# Patient Record
Sex: Female | Born: 1995 | Race: White | Hispanic: No | Marital: Single | Smoking: Never smoker
Health system: Southern US, Community
[De-identification: ages and names within clinical notes are randomized; demographics above are authoritative.]

## PROBLEM LIST (undated history)

## (undated) DIAGNOSIS — R42 Dizziness and giddiness: Secondary | ICD-10-CM

## (undated) DIAGNOSIS — I471 Supraventricular tachycardia, unspecified: Secondary | ICD-10-CM

## (undated) DIAGNOSIS — R0600 Dyspnea, unspecified: Secondary | ICD-10-CM

## (undated) DIAGNOSIS — K589 Irritable bowel syndrome without diarrhea: Secondary | ICD-10-CM

## (undated) DIAGNOSIS — R55 Syncope and collapse: Secondary | ICD-10-CM

## (undated) DIAGNOSIS — J45998 Other asthma: Secondary | ICD-10-CM

## (undated) DIAGNOSIS — R062 Wheezing: Secondary | ICD-10-CM

## (undated) DIAGNOSIS — L309 Dermatitis, unspecified: Secondary | ICD-10-CM

## (undated) DIAGNOSIS — I951 Orthostatic hypotension: Secondary | ICD-10-CM

## (undated) DIAGNOSIS — R0602 Shortness of breath: Secondary | ICD-10-CM

## (undated) DIAGNOSIS — R000 Tachycardia, unspecified: Secondary | ICD-10-CM

## (undated) DIAGNOSIS — R002 Palpitations: Secondary | ICD-10-CM

## (undated) DIAGNOSIS — K3184 Gastroparesis: Secondary | ICD-10-CM

## (undated) HISTORY — DX: Supraventricular tachycardia, unspecified: I47.10

## (undated) HISTORY — DX: Dizziness and giddiness: R42

## (undated) HISTORY — DX: Dermatitis, unspecified: L30.9

## (undated) HISTORY — DX: Orthostatic hypotension: I95.1

## (undated) HISTORY — PX: WISDOM TOOTH EXTRACTION: SHX21

## (undated) HISTORY — DX: Wheezing: R06.2

## (undated) HISTORY — PX: NO PAST SURGERIES: SHX2092

## (undated) HISTORY — DX: Other asthma: J45.998

## (undated) HISTORY — DX: Shortness of breath: R06.02

## (undated) HISTORY — DX: Syncope and collapse: R55

## (undated) HISTORY — DX: Tachycardia, unspecified: R00.0

## (undated) HISTORY — DX: Dyspnea, unspecified: R06.00

## (undated) HISTORY — DX: Supraventricular tachycardia: I47.1

## (undated) HISTORY — DX: Palpitations: R00.2

---
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
## 2015-02-02 ENCOUNTER — Other Ambulatory Visit: Payer: Self-pay | Admitting: Cardiovascular Disease

## 2015-09-18 ENCOUNTER — Telehealth: Payer: Self-pay | Admitting: Internal Medicine

## 2015-09-18 NOTE — Telephone Encounter (Signed)
New Message  Mother called states that the pt is now in college in [place]   1. Can you recommend a cardiologist while she is at school?   2. Her SVT has gotten worse. Requests a call back to determine if medications can be re evaluated.

## 2015-09-19 NOTE — Telephone Encounter (Signed)
Left message for Beth(mom) that Dr Ladona Ridgel did not know any MD's in that area but he would be glad to see her when she is home on break from school.  I have asked she call me back if needed

## 2015-10-03 ENCOUNTER — Encounter: Payer: Self-pay | Admitting: *Deleted

## 2015-10-05 ENCOUNTER — Ambulatory Visit (INDEPENDENT_AMBULATORY_CARE_PROVIDER_SITE_OTHER): Payer: BLUE CROSS/BLUE SHIELD

## 2015-10-05 ENCOUNTER — Encounter: Payer: Self-pay | Admitting: Internal Medicine

## 2015-10-05 ENCOUNTER — Ambulatory Visit (INDEPENDENT_AMBULATORY_CARE_PROVIDER_SITE_OTHER): Payer: BLUE CROSS/BLUE SHIELD | Admitting: Internal Medicine

## 2015-10-05 ENCOUNTER — Ambulatory Visit (INDEPENDENT_AMBULATORY_CARE_PROVIDER_SITE_OTHER): Payer: BLUE CROSS/BLUE SHIELD | Admitting: Allergy and Immunology

## 2015-10-05 VITALS — BP 140/90 | HR 70 | Temp 97.6°F | Resp 15 | Ht 66.73 in | Wt 168.2 lb

## 2015-10-05 VITALS — BP 128/90 | HR 63 | Ht 67.0 in | Wt 168.6 lb

## 2015-10-05 DIAGNOSIS — R55 Syncope and collapse: Secondary | ICD-10-CM

## 2015-10-05 DIAGNOSIS — G909 Disorder of the autonomic nervous system, unspecified: Secondary | ICD-10-CM

## 2015-10-05 DIAGNOSIS — J310 Chronic rhinitis: Secondary | ICD-10-CM | POA: Diagnosis not present

## 2015-10-05 DIAGNOSIS — R062 Wheezing: Secondary | ICD-10-CM

## 2015-10-05 DIAGNOSIS — R002 Palpitations: Secondary | ICD-10-CM

## 2015-10-05 DIAGNOSIS — I471 Supraventricular tachycardia: Secondary | ICD-10-CM

## 2015-10-05 DIAGNOSIS — J301 Allergic rhinitis due to pollen: Secondary | ICD-10-CM

## 2015-10-05 MED ORDER — BECLOMETHASONE DIPROPIONATE 40 MCG/ACT IN AERS: 2.0000 | INHALATION_SPRAY | Freq: Every day | RESPIRATORY_TRACT | Status: DC

## 2015-10-05 MED ORDER — MONTELUKAST SODIUM 10 MG PO TABS: 10.0000 mg | ORAL_TABLET | Freq: Every day | ORAL | Status: DC

## 2015-10-05 MED ORDER — LEVALBUTEROL TARTRATE 45 MCG/ACT IN AERO: 2.0000 | INHALATION_SPRAY | RESPIRATORY_TRACT | Status: AC | PRN

## 2015-10-05 NOTE — Progress Notes (Signed)
HPI Chelsea Garner returns today for followup. 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 She has never passed out. She has had worsening of her near syncopal episodes. She notes that her heart races with these and on her Fit Bit her HR's have varied from 160-200/min. The episodes do not start suddenly although her sensation of nausea, diaphoresis and near syncope do. The spells typically last 10-15 minutes and after she feels drained, drenched in sweat and diaphoretic.  No Known Allergies   Current Outpatient Prescriptions  Medication Sig Dispense Refill  . levalbuterol (XOPENEX HFA) 45 MCG/ACT inhaler Inhale 2 puffs into the lungs every 4 (four) hours as needed for wheezing.    . Levonorgestrel (SKYLA) 13.5 MG IUD by Intrauterine route.    . montelukast (SINGULAIR) 10 MG tablet Take 10 mg by mouth at bedtime.    . propranolol (INDERAL) 10 MG tablet TAKE ONE TABLET BY MOUTH FOUR TIMES DAILY AS NEEDED  30 tablet 11   No current facility-administered medications for this visit.     Past Medical History  Diagnosis Date  . Asthma in remission     As a child  . Eczema   . Dizziness 10/2013    Episode of dizziness & rapid HR while working out.  . Tachycardia 10/2013    Episode of dizziness & rapid HR while working out. Has had several episodes of "heart racing" - sometimes associated with some lightheadedness & blurred vision. Episodes seem to start abruptly & also end abruptly. She continued to have episodes of tachycardia. Had a prolonged episode of "SVT" that lasted 45 mins, she took propanolol & felt better after 10 mins. Also has a geadache after prolonged   . Pre-syncope 10/2013    Episode of dizziness & rapid HR while working out.  . Palpitations     Frequent palps that clinicallly sound like PVCs. Her mother, Waynetta Sandy Banker) palpated her pulse during one of these episodes & she  thought that [facility] was in [place].   . Orthostatic hypotension     Symptoms (bending over to pick up dog food & then stading up quickly)   . Dyspnea     On occasion  . Wheezing     When she jogs. Had asthma as a child.  Marland Kitchen SVT (supraventricular tachycardia) (HCC)     Episodes perhaps 3 times a week  . SOB (shortness of breath)     When she talks.    ROS:   All systems reviewed and negative except as noted in the HPI.   Past Surgical History  Procedure Laterality Date  . No past surgeries       Family History  Problem Relation Age of Onset  . Hypertension Mother   . Hypertension Father   . Thyroid disease Father   . Thyroid disease Maternal Grandmother   . Hypertension Maternal Grandmother   . Hypertension Paternal Grandfather   . Thyroid disease Paternal Aunt   . Thyroid disease Paternal Uncle      Social History   Social History  . Marital Status: Single    Spouse Name: N/A  . Number of Children: N/A  . Years of Education: N/A   Occupational History  . Not on file.   Social History Main Topics  . Smoking status: Never Smoker   . Smokeless tobacco: Never Used  . Alcohol Use: No  .  Drug Use: No  . Sexual Activity: Not on file   Other Topics Concern  . Not on file   Social History Narrative   Is in 11th grade at [employer] with parents and sister     BP 128/90 mmHg  Pulse 63  Ht  (1.702 m)  Wt 168 lb 9.6 oz (76.476 kg)  BMI 26.40 kg/m2  Physical Exam:  Well appearing 19 yo woman, NAD HEENT: Unremarkable Neck:  6 cm JVD, no thyromegally Lymphatics:  No adenopathy Back:  No CVA tenderness Lungs:  Clear with no wheezes HEART:  Regular rate rhythm, no murmurs, no rubs, no clicks Abd:  soft, positive bowel sounds, no organomegally, no rebound, no guarding Ext:  2 plus pulses, no edema, no cyanosis, no clubbing Skin:  No rashes no nodules Neuro:  CN II through XII intact, motor grossly intact  EKG - nsr with rightward  axis   Assess/Plan:

## 2015-10-05 NOTE — Assessment & Plan Note (Signed)
Her episodes have worsened over the past month. I suspect related to the stress of going off to school and being diagnosed with mono. She is encouraged to increase her salt intake.

## 2015-10-05 NOTE — Patient Instructions (Signed)
Medication Instructions:  Your physician recommends that you continue on your current medications as directed. Please refer to the Current Medication list given to you today.   Labwork: None ordered   Testing/Procedures: Your physician has recommended that you wear an event monitor. Event monitors are medical devices that record the heart's electrical activity. Doctors most often [place] these monitors to diagnose arrhythmias. Arrhythmias are problems with the speed or rhythm of the heartbeat. The monitor is a small, portable device. You can wear one while you do your normal daily activities. This is usually used to diagnose what is causing palpitations/syncope (passing out).      Follow-Up: Your physician recommends that you schedule a follow-up appointment in: December with Dr Ladona Ridgel   Any Other Special Instructions Will Be Listed Below (If Applicable).

## 2015-10-05 NOTE — Patient Instructions (Addendum)
    Continue exercise/activity restriction per other physicians as related to Mono.  1.  Singulair  each evening.   2. Nasal Spray: Rhinocort 1-2 spray(s) each nostril once daily for stuffy nose or drainage.    3. Inhalers:  Rescue: Xopenex 2 puffs every 4 hours as needed for cough or wheeze.       -May use 1-2puffs 10-20 minutes prior to exercise.   Preventative: QVAR puffs 2 daily (Rinse, gargle, and spit out after use).   4. Nasal Saline wash to each nostril once daily as directed .   5. Follow up Visit: December 2016 (when home for School break).    Call with any questions or concerns or persisting symptoms   Websites that have reliable Patient information: 1. American Academy of Asthma, Allergy, & Immunology: www.aaaai.org 2. Food Allergy Network: www.foodallergy.org 3. Mothers of Asthmatics: www.aanma.org 4. [facility]: https://www.strong.com/ 5. American College of Allergy, Asthma, & Immunology: BiggerRewards.is or www.acaai.org

## 2015-10-05 NOTE — Assessment & Plan Note (Signed)
It is still unclear to me as to whether she has sinus tachy or svt. We will ask her to wear a cardiac monitor.

## 2015-10-05 NOTE — Progress Notes (Signed)
  History of present illness: Chelsea Garner returns to the office with recent cough, congestion since her recent Mono illness and related to fluctuant weather changes.  She reports initially her symptoms included fever now resolved, though not feeling 100% yet.  She is still with less energy than usual.  She is on exercise/activity restriction right now therefore no Lacrosse.   Prior to this illness she was minimizing her Xopenex use because it increases her heart rate to some degree.  She recently thought the Flonase was not helpful. Denies any ED visits, no Prednisone or antibiotics since her last visit.  She feels her sleep is good and has no other concerns. Outpatient medications: Outpatient Encounter Prescriptions as of 10/05/2015  Medication Sig  . levalbuterol (XOPENEX HFA) 45 MCG/ACT inhaler Inhale 2 puffs into the lungs every 4 (four) hours as needed for wheezing.  . Levonorgestrel (SKYLA) 13.5 MG IUD by Intrauterine route.  . propranolol (INDERAL) 10 MG tablet TAKE ONE TABLET BY MOUTH FOUR TIMES DAILY AS NEEDED   . montelukast (SINGULAIR) 10 MG tablet Take 10 mg by mouth at bedtime.  . [DISCONTINUED] Etonogestrel-Ethinyl Estradiol (NUVARING VA) Place vaginally.  . [DISCONTINUED] fluticasone (FLONASE) 50 MCG/ACT nasal spray Place 1 spray into both nostrils daily.   No facility-administered encounter medications on file as of 10/05/2015.    Known medication allergies: No Known Allergies  Physical examination: Blood pressure 140/90, pulse 70, temperature 97.6 F (36.4 C), resp. rate 15, height 5' 6.73" (1.695 m), weight 168 lb 3.4 oz (76.3 kg).  General: Alert, interactive, in no acute distress. HEENT: TMs pearly gray, turbinates minimally edematous, post-pharynx not erythematous. Neck: Supple without lymphadenopathy. Lungs: Clear to auscultation without wheezing, rhonchi or rales. CV: Normal S1, S2 without murmurs. Skin: Warm and dry, without lesions or rashes.  Diagnostics: Spirometry:  FVC 4.05--102%; FEV1 3.91--111%.   Assessment: 1.  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 2.  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 3.  History of SVT, with occasional trigger by Xopenex, as followed by Cardiology. 4.  Recent diagnosis of Infectious Mononucleuosis, improved, per patient report.    Plan: 1.  Emiko will remain on exercise/activity restriction per other physicians 2.  Begin Rhinocort AQ 1-2 sprays each nostril once daily. 3.  Saline nasal wash each evening in shower. 4.  Begin QVAR 40 mcg 2 puffs each morning, rinse, gargle and spit with water. 5.  Continue Singulair  daily. 6.  Follow-up in December when home from college or sooner if needed.      Sofia Jaquith M. Willa Rough, MD

## 2015-10-06 NOTE — Addendum Note (Signed)
Addended by: Reesa Chew on: 10/06/2015 06:01 PM   Modules accepted: Orders

## 2016-11-23 ENCOUNTER — Encounter (HOSPITAL_COMMUNITY): Payer: Self-pay | Admitting: Family Medicine

## 2016-11-23 ENCOUNTER — Emergency Department (HOSPITAL_COMMUNITY)
Admission: EM | Admit: 2016-11-23 | Discharge: 2016-11-24 | Disposition: A | Discharge status: Home / Self Care | Payer: BLUE CROSS/BLUE SHIELD | Attending: Emergency Medicine | Admitting: Emergency Medicine

## 2016-11-23 DIAGNOSIS — R197 Diarrhea, unspecified: Secondary | ICD-10-CM | POA: Insufficient documentation

## 2016-11-23 DIAGNOSIS — R112 Nausea with vomiting, unspecified: Secondary | ICD-10-CM | POA: Diagnosis not present

## 2016-11-23 DIAGNOSIS — R319 Hematuria, unspecified: Secondary | ICD-10-CM | POA: Diagnosis present

## 2016-11-23 DIAGNOSIS — J45909 Unspecified asthma, uncomplicated: Secondary | ICD-10-CM | POA: Insufficient documentation

## 2016-11-23 HISTORY — DX: Gastroparesis: K31.84

## 2016-11-23 HISTORY — DX: Irritable bowel syndrome, unspecified: K58.9

## 2016-11-23 LAB — URINALYSIS, ROUTINE W REFLEX MICROSCOPIC
Glucose, UA: NEGATIVE mg/dL
Hgb urine dipstick: NEGATIVE
Ketones, ur: 40 mg/dL — AB
Leukocytes, UA: NEGATIVE
Nitrite: NEGATIVE
Protein, ur: NEGATIVE mg/dL
Specific Gravity, Urine: 1.028 (ref 1.005–1.030)
pH: 5.5 (ref 5.0–8.0)

## 2016-11-23 LAB — COMPREHENSIVE METABOLIC PANEL WITH GFR
ALT: 13 U/L — ABNORMAL LOW (ref 14–54)
AST: 17 U/L (ref 15–41)
Albumin: 5.1 g/dL — ABNORMAL HIGH (ref 3.5–5.0)
Alkaline Phosphatase: 62 U/L (ref 38–126)
Anion gap: 9 (ref 5–15)
BUN: 10 mg/dL (ref 6–20)
CO2: 23 mmol/L (ref 22–32)
Calcium: 9.6 mg/dL (ref 8.9–10.3)
Chloride: 106 mmol/L (ref 101–111)
Creatinine, Ser: 0.86 mg/dL (ref 0.44–1.00)
GFR calc Af Amer: >60 mL/min
GFR calc non Af Amer: >60 mL/min
Glucose, Bld: 77 mg/dL (ref 65–99)
Potassium: 4 mmol/L (ref 3.5–5.1)
Sodium: 138 mmol/L (ref 135–145)
Total Bilirubin: 0.8 mg/dL (ref 0.3–1.2)
Total Protein: 8.1 g/dL (ref 6.5–8.1)

## 2016-11-23 LAB — CBC
HCT: 41.3 % (ref 36.0–46.0)
HEMOGLOBIN: 13.5 g/dL (ref 12.0–15.0)
MCH: 26 pg (ref 26.0–34.0)
MCHC: 32.7 g/dL (ref 30.0–36.0)
MCV: 79.4 fL (ref 78.0–100.0)
Platelets: 274 10*3/uL (ref 150–400)
RBC: 5.2 MIL/uL — ABNORMAL HIGH (ref 3.87–5.11)
RDW: 15.5 % (ref 11.5–15.5)
WBC: 6.9 10*3/uL (ref 4.0–10.5)

## 2016-11-23 LAB — LIPASE, BLOOD: Lipase: 21 U/L (ref 11–51)

## 2016-11-23 MED ORDER — ONDANSETRON 4 MG PO TBDP: 4.0000 mg | ORAL_TABLET | Freq: Three times a day (TID) | ORAL | 0 refills | Status: AC | PRN

## 2016-11-23 MED ORDER — GI COCKTAIL ~~LOC~~
30.0000 mL | Freq: Once | ORAL | Status: AC
  Administered 2016-11-23: 30 mL via ORAL
  Filled 2016-11-23: qty 30

## 2016-11-23 MED ORDER — HYOSCYAMINE SULFATE 0.125 MG PO TABS
0.1250 mg | ORAL_TABLET | Freq: Once | ORAL | Status: AC
  Administered 2016-11-23: 0.125 mg via ORAL
  Filled 2016-11-23: qty 1

## 2016-11-23 MED ORDER — PB-HYOSCY-ATROPINE-SCOPOLAMINE 16.2 MG PO TABS: 1.0000 | ORAL_TABLET | Freq: Three times a day (TID) | ORAL | 0 refills | Status: AC | PRN

## 2016-11-23 MED ORDER — ONDANSETRON HCL 4 MG/2ML IJ SOLN
4.0000 mg | Freq: Once | INTRAMUSCULAR | Status: AC
  Administered 2016-11-23: 4 mg via INTRAVENOUS
  Filled 2016-11-23: qty 2

## 2016-11-23 MED ORDER — SODIUM CHLORIDE 0.9 % IV BOLUS (SEPSIS)
1000.0000 mL | Freq: Once | INTRAVENOUS | Status: AC
  Administered 2016-11-23: 1000 mL via INTRAVENOUS

## 2016-11-23 NOTE — ED Triage Notes (Signed)
Patient reports experiencing left lower abd pain, blood in urine, nausea, vomiting, and generalized weakness that is from a recurrent problem but started 3 days ago. Took Zofran earlier this morning with no relief. The original complaint started 3-4 months ago after eating at [employer]. 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 [facility] and dx with post infectious gastroparesis and post infectious IBS.

## 2016-11-23 NOTE — ED Provider Notes (Signed)
[facility] DEPT Provider Note   CSN: [id number] Arrival date & time: 11/23/16  1712     History   Chief Complaint Chief Complaint  Patient presents with  . Abdominal Pain  . Hematuria  . Constipation  . Emesis    HPI Chelsea Garner is a 20 y.o. female.  The history is provided by the patient and a parent. No language interpreter was used.   Chelsea Garner is a 20 y.o. female who presents to the Emergency Department complaining of Abdominal pain, vomiting, diarrhea. She reports several months of intermittent abdominal pain, vomiting, diarrhea after getting sick from eating Janine [place]bo. Over the last 3 days she's had recurrent symptoms with vomiting everything she eats and drinks as well as left lower quadrant abdominal cramping. She had an episode of hematuria earlier today, now resolved. No fevers, vaginal discharge, dysuria. She has minimal tenderness on examination and is nontoxic-appearing. She had an outpatient CT scan performed 2 weeks ago for similar symptoms with no acute pathology. Unable to obtain CT scan report during her ED stay. 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  Past Medical History:  Diagnosis Date  . Asthma in remission    As a child  . Dizziness 10/2013   Episode of dizziness & rapid HR while working out.  Marland Kitchen Dyspnea    On occasion  . Eczema   . Gastroparesis   . IBS (irritable bowel syndrome)   . Orthostatic hypotension    Symptoms (bending over to pick up dog food & then stading up quickly)   . Palpitations    Frequent palps that clinicallly sound like PVCs. Her mother, Chelsea Garner Investment banker, [employer]) palpated her pulse during one of these episodes & she thought that [place] was in [place].   . Pre-syncope 10/2013   Episode of dizziness & rapid HR while working out.  . SOB (shortness of breath)    When she talks.  . SVT (supraventricular tachycardia) (HCC)    Episodes perhaps 3 times a week  .  Tachycardia 10/2013   Episode of dizziness & rapid HR while working out. Has had several episodes of "heart racing" - sometimes associated with some lightheadedness & blurred vision. Episodes seem to start abruptly & also end abruptly. She continued to have episodes of tachycardia. Had a prolonged episode of "SVT" that lasted 45 mins, she took propanolol & felt better after 10 mins. Also has a geadache after prolonged   . Wheezing    When she jogs. Had asthma as a child.    Patient Active Problem List   Diagnosis Date Noted  . Autonomic dysfunction 10/05/2015  . Paroxysmal supraventricular tachycardia ([facility]) 08/11/2013  . Nonspecific abnormal results of endocrine function study 07/05/2013  . Orthostatic hypotension 07/05/2013    Past Surgical History:  Procedure Laterality Date  . NO PAST SURGERIES    . WISDOM TOOTH EXTRACTION      OB History    No data available       Home Medications    Prior to Admission medications   Medication Sig Start Date End Date Taking? Authorizing Provider  levalbuterol [facility] HFA) 45 MCG/ACT inhaler Inhale 2 puffs into the lungs every 4 (four) hours as needed for wheezing. 10/05/15  Yes Roselyn Malachy Moan, MD  Levonorgestrel (SKYLA) 13.5 MG IUD 1 Device by Intrauterine route once.    Yes Historical Provider, MD  ondansetron (ZOFRAN) 4 MG tablet Take 4 mg by mouth every 8 (eight) hours  as needed for nausea or vomiting.  11/22/16  Yes Historical Provider, MD  promethazine (PHENERGAN) 25 MG tablet Take 25 mg by mouth every 6 (six) hours as needed for nausea or vomiting.  11/14/16  Yes Historical Provider, MD  sertraline (ZOLOFT) 25 MG tablet Take 25 mg by mouth daily. 11/11/16  Yes Historical Provider, MD  belladona alk-PHENObarbital (DONNATAL) 16.2 MG tablet Take 1 tablet by mouth every 8 (eight) hours as needed. 11/23/16   Quintella Reichert, MD  ondansetron (ZOFRAN ODT) 4 MG disintegrating tablet Take 1 tablet (4 mg total) by mouth every 8 (eight) hours as  needed for nausea or vomiting. 11/23/16   Quintella Reichert, MD    Family History Family History  Problem Relation Age of Onset  . Hypertension Mother   . Hypertension Father   . Thyroid disease Father   . Thyroid disease Maternal Grandmother   . Hypertension Maternal Grandmother   . Hypertension Paternal Grandfather   . Thyroid disease Paternal Aunt   . Thyroid disease Paternal Uncle     Social History Social History  Substance Use Topics  . Smoking status: Never Smoker  . Smokeless tobacco: Never Used  . Alcohol use Yes     Comment: Once a month     Allergies   Patient has no known allergies.   Review of Systems Review of Systems  All other systems reviewed and are negative.    Physical Exam Updated Vital Signs BP 122/70 (BP Location: Left Arm)   Pulse 60   Temp 98.1 F (36.7 C) (Oral)   Resp 18   Ht _0  (1.702 m)   Wt 161 lb (73 kg)   SpO2 100%   BMI 25.22 kg/m   Physical Exam  Constitutional: She is oriented to person, place, and time. She appears well-developed and well-nourished.  HENT:  Head: Normocephalic and atraumatic.  Cardiovascular: Normal rate and regular rhythm.   No murmur heard. Pulmonary/Chest: Effort normal and breath sounds normal. No respiratory distress.  Abdominal: Soft. There is no tenderness. There is no rebound and no guarding.  Musculoskeletal: She exhibits no edema or tenderness.  Neurological: She is alert and oriented to person, place, and time.  Skin: Skin is warm and dry.  Psychiatric: She has a normal mood and affect. Her behavior is normal.  Nursing note and vitals reviewed.    ED Treatments / Results  Labs (all labs ordered are listed, but only abnormal results are displayed) Labs Reviewed  COMPREHENSIVE METABOLIC PANEL - Abnormal; Notable for the following:       Result Value   Albumin 5.1 (*)    ALT 13 (*)    All other components within normal limits  CBC - Abnormal; Notable for the following:    RBC 5.20  (*)    All other components within normal limits  URINALYSIS, ROUTINE W REFLEX MICROSCOPIC (NOT AT [facility]) - Abnormal; Notable for the following:    Color, Urine AMBER (*)    APPearance CLOUDY (*)    Bilirubin Urine SMALL (*)    Ketones, ur 40 (*)    All other components within normal limits  LIPASE, BLOOD  POC URINE PREG, ED    EKG  EKG Interpretation None       Radiology No results found.  Procedures Procedures (including critical care time)  Medications Ordered in ED Medications  sodium chloride 0.9 % bolus 1,000 mL (0 mLs Intravenous Stopped 11/23/16 2203)  hyoscyamine (LEVSIN, ANASPAZ) tablet 0.125 mg (0.125 mg  Oral Given 11/23/16 2115)  ondansetron [facility]) injection 4 mg (4 mg Intravenous Given 11/23/16 2110)  gi cocktail (Maalox,Lidocaine,Donnatal) (30 mLs Oral Given 11/23/16 2245)     Initial Impression / Assessment and Plan / ED Course  I have reviewed the triage vital signs and the nursing notes.  Pertinent labs & imaging results that were available during my care of the patient were reviewed by me and considered in my medical decision making (see chart for details).  Clinical Course      After treatment with IV fluids, antiemetics, Levsin and GI cocktail the emergency department her symptoms are improved and she is able to tolerate oral fluids. Plan to DC home with outpatient follow-up as well as close return precautions.  Patient recently had a negative urine pregnancy tests during her prior ED stay and she has an IUD in place, current clinical picture is not consistent with pregnancy.  Final Clinical Impressions(s) / ED Diagnoses   Final diagnoses:  Nausea vomiting and diarrhea    New Prescriptions New Prescriptions   BELLADONA ALK-PHENOBARBITAL (DONNATAL) 16.2 MG TABLET    Take 1 tablet by mouth every 8 (eight) hours as needed.   ONDANSETRON (ZOFRAN ODT) 4 MG DISINTEGRATING TABLET    Take 1 tablet (4 mg total) by mouth every 8 (eight) hours as  needed for nausea or vomiting.     Quintella Reichert, MD 11/24/16 [phone]

## 2016-11-28 ENCOUNTER — Other Ambulatory Visit: Payer: Self-pay | Admitting: Gastroenterology

## 2016-11-28 DIAGNOSIS — R112 Nausea with vomiting, unspecified: Secondary | ICD-10-CM

## 2016-11-28 DIAGNOSIS — R109 Unspecified abdominal pain: Secondary | ICD-10-CM

## 2016-11-29 ENCOUNTER — Other Ambulatory Visit: Payer: BLUE CROSS/BLUE SHIELD
